# Patient Record
Sex: Female | Born: 1975
Health system: Southern US, Community
[De-identification: ages and names within clinical notes are randomized; demographics above are authoritative.]

## PROBLEM LIST (undated history)

## (undated) DIAGNOSIS — R011 Cardiac murmur, unspecified: Secondary | ICD-10-CM

## (undated) DIAGNOSIS — C801 Malignant (primary) neoplasm, unspecified: Secondary | ICD-10-CM

## (undated) DIAGNOSIS — Q793 Gastroschisis: Secondary | ICD-10-CM

## (undated) DIAGNOSIS — Q447 Other congenital malformation of liver, unspecified: Secondary | ICD-10-CM

## (undated) DIAGNOSIS — E785 Hyperlipidemia, unspecified: Secondary | ICD-10-CM

## (undated) HISTORY — PX: TONSILLECTOMY: SUR1361

## (undated) HISTORY — PX: OTHER SURGICAL HISTORY: SHX169

## (undated) HISTORY — DX: Malignant (primary) neoplasm, unspecified: C80.1

## (undated) HISTORY — DX: Hyperlipidemia, unspecified: E78.5

## (undated) HISTORY — DX: Cardiac murmur, unspecified: R01.1

## (undated) HISTORY — DX: Gastroschisis: Q79.3

## (undated) HISTORY — PX: THYROIDECTOMY: SHX17

## (undated) HISTORY — DX: Other congenital malformation of liver, unspecified: Q44.70

---

## 1998-05-05 ENCOUNTER — Encounter: Payer: Self-pay | Admitting: Emergency Medicine

## 1998-05-05 ENCOUNTER — Emergency Department (HOSPITAL_COMMUNITY): Admission: EM | Admit: 1998-05-05 | Discharge: 1998-05-06 | Payer: Self-pay | Admitting: Emergency Medicine

## 1998-05-06 ENCOUNTER — Observation Stay (HOSPITAL_COMMUNITY): Admission: AD | Admit: 1998-05-06 | Discharge: 1998-05-07 | Payer: Self-pay | Admitting: Internal Medicine

## 1998-12-22 ENCOUNTER — Other Ambulatory Visit: Admission: RE | Admit: 1998-12-22 | Discharge: 1998-12-22 | Payer: Self-pay

## 1999-12-17 ENCOUNTER — Ambulatory Visit (HOSPITAL_COMMUNITY): Admission: RE | Admit: 1999-12-17 | Discharge: 1999-12-17 | Payer: Self-pay

## 2000-10-27 ENCOUNTER — Other Ambulatory Visit: Admission: RE | Admit: 2000-10-27 | Discharge: 2000-10-27 | Payer: Self-pay | Admitting: *Deleted

## 2000-12-28 ENCOUNTER — Other Ambulatory Visit: Admission: RE | Admit: 2000-12-28 | Discharge: 2000-12-28 | Payer: Self-pay | Admitting: *Deleted

## 2000-12-28 ENCOUNTER — Encounter (INDEPENDENT_AMBULATORY_CARE_PROVIDER_SITE_OTHER): Payer: Self-pay | Admitting: Specialist

## 2001-02-01 ENCOUNTER — Encounter: Admission: RE | Admit: 2001-02-01 | Discharge: 2001-02-01 | Payer: Self-pay | Admitting: Family Medicine

## 2001-02-01 ENCOUNTER — Encounter: Payer: Self-pay | Admitting: Family Medicine

## 2001-06-14 ENCOUNTER — Other Ambulatory Visit: Admission: RE | Admit: 2001-06-14 | Discharge: 2001-06-14 | Payer: Self-pay | Admitting: Obstetrics and Gynecology

## 2001-08-07 ENCOUNTER — Encounter: Payer: Self-pay | Admitting: Family Medicine

## 2001-08-07 ENCOUNTER — Encounter: Admission: RE | Admit: 2001-08-07 | Discharge: 2001-08-07 | Payer: Self-pay | Admitting: Family Medicine

## 2001-11-01 ENCOUNTER — Other Ambulatory Visit: Admission: RE | Admit: 2001-11-01 | Discharge: 2001-11-01 | Payer: Self-pay | Admitting: Obstetrics and Gynecology

## 2002-03-13 ENCOUNTER — Encounter: Payer: Self-pay | Admitting: Family Medicine

## 2002-03-13 ENCOUNTER — Encounter: Admission: RE | Admit: 2002-03-13 | Discharge: 2002-03-13 | Payer: Self-pay | Admitting: Family Medicine

## 2002-07-18 ENCOUNTER — Other Ambulatory Visit: Admission: RE | Admit: 2002-07-18 | Discharge: 2002-07-18 | Payer: Self-pay | Admitting: Obstetrics and Gynecology

## 2003-05-06 ENCOUNTER — Emergency Department (HOSPITAL_COMMUNITY): Admission: EM | Admit: 2003-05-06 | Discharge: 2003-05-06 | Payer: Self-pay | Admitting: Emergency Medicine

## 2003-08-19 ENCOUNTER — Other Ambulatory Visit: Admission: RE | Admit: 2003-08-19 | Discharge: 2003-08-19 | Payer: Self-pay | Admitting: Obstetrics and Gynecology

## 2004-05-03 ENCOUNTER — Inpatient Hospital Stay (HOSPITAL_COMMUNITY): Admission: EM | Admit: 2004-05-03 | Discharge: 2004-05-06 | Payer: Self-pay | Admitting: Emergency Medicine

## 2005-05-11 ENCOUNTER — Ambulatory Visit: Payer: Self-pay | Admitting: Gastroenterology

## 2005-06-03 ENCOUNTER — Ambulatory Visit: Payer: Self-pay | Admitting: Gastroenterology

## 2005-06-03 ENCOUNTER — Encounter (INDEPENDENT_AMBULATORY_CARE_PROVIDER_SITE_OTHER): Payer: Self-pay | Admitting: *Deleted

## 2005-06-03 LAB — HM COLONOSCOPY

## 2005-07-22 ENCOUNTER — Ambulatory Visit (HOSPITAL_COMMUNITY): Admission: RE | Admit: 2005-07-22 | Discharge: 2005-07-22 | Payer: Self-pay

## 2007-08-01 ENCOUNTER — Inpatient Hospital Stay (HOSPITAL_COMMUNITY): Admission: AD | Admit: 2007-08-01 | Discharge: 2007-08-02 | Payer: Self-pay | Admitting: Obstetrics & Gynecology

## 2007-08-18 ENCOUNTER — Ambulatory Visit (HOSPITAL_COMMUNITY): Admission: RE | Admit: 2007-08-18 | Discharge: 2007-08-18 | Payer: Self-pay | Admitting: Obstetrics

## 2007-08-24 ENCOUNTER — Ambulatory Visit: Payer: Self-pay | Admitting: Oncology

## 2007-08-31 LAB — CBC WITH DIFFERENTIAL/PLATELET
BASO%: 0.3 % (ref 0.0–2.0)
Basophils Absolute: 0 10*3/uL (ref 0.0–0.1)
EOS%: 1.1 % (ref 0.0–7.0)
Eosinophils Absolute: 0.1 10*3/uL (ref 0.0–0.5)
HCT: 30 % — ABNORMAL LOW (ref 34.8–46.6)
HGB: 10.6 g/dL — ABNORMAL LOW (ref 11.6–15.9)
LYMPH%: 13.7 % — ABNORMAL LOW (ref 14.0–48.0)
MCH: 31.3 pg (ref 26.0–34.0)
MCHC: 35.4 g/dL (ref 32.0–36.0)
MCV: 88.6 fL (ref 81.0–101.0)
MONO#: 0.9 10*3/uL (ref 0.1–0.9)
MONO%: 10.5 % (ref 0.0–13.0)
NEUT#: 6.3 10*3/uL (ref 1.5–6.5)
NEUT%: 74.4 % (ref 39.6–76.8)
Platelets: 101 10*3/uL — ABNORMAL LOW (ref 145–400)
RBC: 3.39 10*6/uL — ABNORMAL LOW (ref 3.70–5.32)
RDW: 13.1 % (ref 11.3–14.5)
WBC: 8.5 10*3/uL (ref 3.9–10.0)
lymph#: 1.2 10*3/uL (ref 0.9–3.3)

## 2007-08-31 LAB — CHCC SMEAR

## 2007-09-11 LAB — CBC WITH DIFFERENTIAL/PLATELET
BASO%: 0.3 % (ref 0.0–2.0)
Basophils Absolute: 0 10*3/uL (ref 0.0–0.1)
EOS%: 0.7 % (ref 0.0–7.0)
Eosinophils Absolute: 0.1 10*3/uL (ref 0.0–0.5)
HCT: 32 % — ABNORMAL LOW (ref 34.8–46.6)
HGB: 11.4 g/dL — ABNORMAL LOW (ref 11.6–15.9)
LYMPH%: 18.8 % (ref 14.0–48.0)
MCH: 31.7 pg (ref 26.0–34.0)
MCHC: 35.5 g/dL (ref 32.0–36.0)
MCV: 89.2 fL (ref 81.0–101.0)
MONO#: 0.7 10*3/uL (ref 0.1–0.9)
MONO%: 8.7 % (ref 0.0–13.0)
NEUT#: 5.9 10*3/uL (ref 1.5–6.5)
NEUT%: 71.5 % (ref 39.6–76.8)
Platelets: 108 10*3/uL — ABNORMAL LOW (ref 145–400)
RBC: 3.58 10*6/uL — ABNORMAL LOW (ref 3.70–5.32)
RDW: 13.6 % (ref 11.3–14.5)
WBC: 8.3 10*3/uL (ref 3.9–10.0)
lymph#: 1.6 10*3/uL (ref 0.9–3.3)

## 2007-09-27 LAB — CBC WITH DIFFERENTIAL/PLATELET
BASO%: 0.4 % (ref 0.0–2.0)
Basophils Absolute: 0 10*3/uL (ref 0.0–0.1)
EOS%: 0.5 % (ref 0.0–7.0)
Eosinophils Absolute: 0 10*3/uL (ref 0.0–0.5)
HCT: 33.1 % — ABNORMAL LOW (ref 34.8–46.6)
HGB: 11.6 g/dL (ref 11.6–15.9)
LYMPH%: 19 % (ref 14.0–48.0)
MCH: 31.4 pg (ref 26.0–34.0)
MCHC: 35 g/dL (ref 32.0–36.0)
MCV: 89.7 fL (ref 81.0–101.0)
MONO#: 0.8 10*3/uL (ref 0.1–0.9)
MONO%: 9.6 % (ref 0.0–13.0)
NEUT#: 5.9 10*3/uL (ref 1.5–6.5)
NEUT%: 70.5 % (ref 39.6–76.8)
Platelets: 104 10*3/uL — ABNORMAL LOW (ref 145–400)
RBC: 3.69 10*6/uL — ABNORMAL LOW (ref 3.70–5.32)
RDW: 14.1 % (ref 11.3–14.5)
WBC: 8.3 10*3/uL (ref 3.9–10.0)
lymph#: 1.6 10*3/uL (ref 0.9–3.3)

## 2007-10-05 LAB — CBC WITH DIFFERENTIAL/PLATELET
BASO%: 0.2 % (ref 0.0–2.0)
Basophils Absolute: 0 10*3/uL (ref 0.0–0.1)
EOS%: 0.6 % (ref 0.0–7.0)
Eosinophils Absolute: 0.1 10*3/uL (ref 0.0–0.5)
HCT: 33.8 % — ABNORMAL LOW (ref 34.8–46.6)
HGB: 11.9 g/dL (ref 11.6–15.9)
LYMPH%: 20.5 % (ref 14.0–48.0)
MCH: 31.5 pg (ref 26.0–34.0)
MCHC: 35.2 g/dL (ref 32.0–36.0)
MCV: 89.6 fL (ref 81.0–101.0)
MONO#: 0.9 10*3/uL (ref 0.1–0.9)
MONO%: 10.1 % (ref 0.0–13.0)
NEUT#: 5.9 10*3/uL (ref 1.5–6.5)
NEUT%: 68.6 % (ref 39.6–76.8)
Platelets: 96 10*3/uL — ABNORMAL LOW (ref 145–400)
RBC: 3.78 10*6/uL (ref 3.70–5.32)
RDW: 13.9 % (ref 11.3–14.5)
WBC: 8.6 10*3/uL (ref 3.9–10.0)
lymph#: 1.8 10*3/uL (ref 0.9–3.3)

## 2007-10-10 ENCOUNTER — Ambulatory Visit: Payer: Self-pay | Admitting: Oncology

## 2007-10-12 LAB — CBC WITH DIFFERENTIAL/PLATELET
BASO%: 0.5 % (ref 0.0–2.0)
Basophils Absolute: 0 10*3/uL (ref 0.0–0.1)
EOS%: 0.6 % (ref 0.0–7.0)
Eosinophils Absolute: 0 10*3/uL (ref 0.0–0.5)
HCT: 35.4 % (ref 34.8–46.6)
HGB: 12.6 g/dL (ref 11.6–15.9)
LYMPH%: 19.2 % (ref 14.0–48.0)
MCH: 31.8 pg (ref 26.0–34.0)
MCHC: 35.5 g/dL (ref 32.0–36.0)
MCV: 89.5 fL (ref 81.0–101.0)
MONO#: 0.6 10*3/uL (ref 0.1–0.9)
MONO%: 7.1 % (ref 0.0–13.0)
NEUT#: 6.2 10*3/uL (ref 1.5–6.5)
NEUT%: 72.6 % (ref 39.6–76.8)
Platelets: 103 10*3/uL — ABNORMAL LOW (ref 145–400)
RBC: 3.95 10*6/uL (ref 3.70–5.32)
RDW: 14.4 % (ref 11.3–14.5)
WBC: 8.6 10*3/uL (ref 3.9–10.0)
lymph#: 1.7 10*3/uL (ref 0.9–3.3)

## 2007-10-18 ENCOUNTER — Inpatient Hospital Stay (HOSPITAL_COMMUNITY): Admission: AD | Admit: 2007-10-18 | Discharge: 2007-10-22 | Payer: Self-pay | Admitting: Obstetrics and Gynecology

## 2007-11-28 ENCOUNTER — Ambulatory Visit: Payer: Self-pay | Admitting: Oncology

## 2007-11-30 LAB — CBC WITH DIFFERENTIAL/PLATELET
BASO%: 0.2 % (ref 0.0–2.0)
Basophils Absolute: 0 10*3/uL (ref 0.0–0.1)
EOS%: 1.3 % (ref 0.0–7.0)
Eosinophils Absolute: 0.1 10*3/uL (ref 0.0–0.5)
HCT: 36.5 % (ref 34.8–46.6)
HGB: 12.6 g/dL (ref 11.6–15.9)
LYMPH%: 29.3 % (ref 14.0–48.0)
MCH: 30.8 pg (ref 26.0–34.0)
MCHC: 34.6 g/dL (ref 32.0–36.0)
MCV: 89 fL (ref 81.0–101.0)
MONO#: 0.5 10*3/uL (ref 0.1–0.9)
MONO%: 6.8 % (ref 0.0–13.0)
NEUT#: 4.3 10*3/uL (ref 1.5–6.5)
NEUT%: 62.4 % (ref 39.6–76.8)
Platelets: 125 10*3/uL — ABNORMAL LOW (ref 145–400)
RBC: 4.1 10*6/uL (ref 3.70–5.32)
RDW: 13.4 % (ref 11.3–14.5)
WBC: 6.9 10*3/uL (ref 3.9–10.0)
lymph#: 2 10*3/uL (ref 0.9–3.3)

## 2008-01-21 ENCOUNTER — Ambulatory Visit: Payer: Self-pay | Admitting: Oncology

## 2008-03-14 ENCOUNTER — Ambulatory Visit: Payer: Self-pay | Admitting: Oncology

## 2009-03-05 ENCOUNTER — Encounter: Admission: RE | Admit: 2009-03-05 | Discharge: 2009-03-05 | Payer: Self-pay | Admitting: Endocrinology

## 2010-07-19 ENCOUNTER — Encounter: Payer: Self-pay | Admitting: Endocrinology

## 2010-07-19 ENCOUNTER — Encounter: Payer: Self-pay | Admitting: *Deleted

## 2010-10-27 ENCOUNTER — Other Ambulatory Visit: Payer: Self-pay | Admitting: Obstetrics

## 2010-11-10 NOTE — Discharge Summary (Signed)
NAME:  April Herman, April Herman NO.:  [id number]   MEDICAL RECORD NO.:  [id number]          PATIENT TYPE:  INP   LOCATION:  9107                          FACILITY:  [facility]   PHYSICIAN:  Lendon Colonel, MD   DATE OF BIRTH:  [birth year]   DATE OF ADMISSION:  10/18/2007  DATE OF DISCHARGE:  10/22/2007                               DISCHARGE SUMMARY   CHIEF COMPLAINT:  Induction of labor.   HISTORY OF PRESENT ILLNESS:  This 35 year old G1 admitted at 40 weeks  and 4 days for induction of labor.  The patient has been followed in  pregnancy for gestational thrombocytopenia versus ITP.  She is status  post maternal fetal medicine consultation and had hematology following.  Her platelets had gone down as low as the 80s, which in the later part  of the pregnancy have been stable in the low 100s.  The patient also has  a history of HSV for which she has been on Valtrex since 32 weeks, and  the patient has history of gastroschisis repair as a child, a history of  in-vitro fertilization for this current pregnancy, has a history of  hypothyroidism with her last thyroid checked approximately 1 month prior  to admission which was within normal limits.  She had no adjustments to  her levothyroxine since the start of pregnancy.  She also was found to  have an increased nuchal thickness at the early first-trimester  screening and normal ultrasound report, normal cardiac fetal echo.  The  patient is O negative and had a Klebsiella UTI in the beginning of the  pregnancy.  Her prenatal labs were unremarkable, other than her  thrombocytopenia.   PAST MEDICAL HISTORY:  Her medical history is significant for  infertility status post partial thyroidectomy with resultant  hypothyroidism, gestational thrombocytopenia, HSV, and gastroschisis  repair.   ALLERGIES:  She has had no known drug allergies.   PHYSICAL EXAMINATION:  Her physical exam at the time of admission was  unremarkable with an  unfavorable cervix.  Fetal testing was reactive.   HOSPITAL COURSE:  Given 40 weeks and 4 days' gestation with several  prenatal issues including thrombocytopenia for which is currently  stable, decision was made to begin cervical ripening.  The patient was  given Misoprostol overnight with a plan to closely follow her platelets  and began dexamethasone if her platelets dropped.  On hospital day #2,  the patient began getting uncomfortable with her contractions after  Pitocin induction.  The patient had gotten a single dose of Misoprostol  overnight.  Her contractions began every 2 minutes and she was unable to  get a second dose of Misoprostol.  Pitocin 2 milliunits were started at  4:30 a.m.  Throughout the day, her Pitocin was increased.  The patient  was uncomfortable and received an epidural.  Later on hospital day #2,  given the lack of cervical dilatation, the cervical Foley was placed  without complication.  By 8 p.m. on hospital day #2, the patient had  made no significant cervical change.  Her cervical Foley was still in  place, as well as her epidural,  and decision was made to turn off the  Pitocin, allow the patient to eat and rest overnight with further  cervical ripening. overnight.  The patient had her epidural for  continued rest overnight.  By hospital day #3 at 8 in the morning, the  patient's cervix had dilated to 5 cm, 80%, and -2 station.  She was  AROM'd for clear fluid.  The baby's testing remained reactive and her  platelets were stable at 85.  The plan was to continue to check her  platelets q.4 h.  The patient at 2 p.m. had a spontaneous vaginal  delivery over a third-degree episiotomy, cord pH was 7.1.  An episiotomy  was found to expedite delivery as the vacuum was contraindicated given  the thrombocytopenia.  That note can be found in a separate dictation.  Postpartum, the patient did well.  Her platelets remained stable at 87  and on postoperative day #2,  the patient was discharged to home with  Percocet, Colace, and Motrin.   DISCHARGE DIAGNOSIS:  Status post at term vaginal delivery after  induction of labor for gestational thrombocytopenia.   DISCHARGE CONDITION:  Stable.   DISCHARGE DISPOSITION:  To home.   DISCHARGE MEDICATIONS:  Motrin, Percocet, and Colace.      Lendon Colonel, MD  Electronically Signed     KAF/MEDQ  D:  11/05/2007  T:  11/05/2007  Job:  [id number]

## 2010-11-13 NOTE — H&P (Signed)
NAME:  April Herman, April Herman NO.:  [id number]   MEDICAL RECORD NO.:  [id number]          PATIENT TYPE:  EMS   LOCATION:  ED                           FACILITY:  [facility]   PHYSICIAN:  Abigail Miyamoto, M.D. DATE OF BIRTH:  04/03/1976   DATE OF ADMISSION:  05/03/2004  DATE OF DISCHARGE:                                HISTORY & PHYSICAL   CHIEF COMPLAINT:  Abdominal pain.   HISTORY OF PRESENT ILLNESS:  This is a 35 year old female who has had a  history of small bowel obstructions in the past, who now presents with 24-  hour history of cramping, abdominal pain, nausea, and vomiting.  Before she  has had emesis at least four times at the start of last evening.  She also  reports she has had both constipation and diarrhea on and off for a week.  The pain is cramping and continuous.   PAST MEDICAL HISTORY:  1.  Significant for having had a gastroschisis as a newborn and surgery      secondary to this.  2.  She has had multiple small bowel obstructions in the past.  All have      been resolved spontaneously with conservative measures and NG      suctioning.  3.  She has also had a history of thyroid cancer, status post lobectomy by      Dr. Marcy Panning.   CURRENT MEDICATIONS:  1.  Birth control medications.  2.  Synthroid.   FAMILY HISTORY:  Unremarkable.   SOCIAL HISTORY:  She does not smoke.  She does not drink alcohol.   ALLERGIES:  She has no known drug allergies.   REVIEW OF SYSTEMS:  Negative for chest pain, shortness of breath, and  dysuria, and is otherwise negative except for the above mentioned.   PHYSICAL EXAMINATION:  GENERAL:  Reveals a thin female in mild discomfort.  Generally she is mildly uncomfortable.  VITAL SIGNS:  Temperature is 97.7, pulse is 91, respiratory rate is 16,  blood pressure is 103/70.  HEENT:  Eyes:  She is anicteric.  Pupils are reactive bilaterally.  Ears,  nose, mouth, and throat:  Her ears and nose are normal in appearance.  Oropharynx is clear.  NECK:  Supple.  There are no neck masses.  There is no thyromegaly.  There  is no adenopathy.  LUNGS:  Clear to auscultation bilaterally.  Normal respiratory effort.  CARDIOVASCULAR:  Regular rate and rhythm with no murmurs.  There is no  peripheral edema.  ABDOMEN:  Soft.  There is mild diffuse tenderness.  It is nondistended.  There are rare bowel sounds.  There is a well-healed transverse incision  across the abdomen with no evidence of hernias.  EXTREMITIES:  Warm and well-perfused.  NEUROLOGIC:  Nonfocal.   LABORATORY DATA:  The patient has labs from Urgent Care, which shows her to  have a white blood count of 11.4,  hemoglobin of 13.9, and platelets of 163.   She has a TV of the abdomen showing her to have a large amount of stool in  her colon.  There are some mild air and diffuse small bowel leaks and a  mildly dilated stomach.   IMPRESSION:  Patient with a partial small bowel obstruction.  At this point  the plan will be to admit her to the hospital, IV rehydrate her, give her  enemas to help clear her constipation under good __________control, and we  may need to place the NG tube __________ we proceed conservatively and be  successful resolving this obstruction without surgery as she has done in the  past.      DB/MEDQ  D:  05/03/2004  T:  05/03/2004  Job:  [id number]

## 2011-03-19 LAB — FETAL FIBRONECTIN: Fetal Fibronectin: NEGATIVE

## 2011-03-23 LAB — CBC
HCT: 27.9 — ABNORMAL LOW
HCT: 32.1 — ABNORMAL LOW
HCT: 33.1 — ABNORMAL LOW
HCT: 36.8
HCT: 38.6
Hemoglobin: 11.4 — ABNORMAL LOW
Hemoglobin: 11.8 — ABNORMAL LOW
Hemoglobin: 13
Hemoglobin: 13.4
Hemoglobin: 9.9 — ABNORMAL LOW
MCHC: 34.7
MCHC: 35.3
MCHC: 35.4
MCHC: 35.5
MCHC: 35.5
MCHC: 35.7
MCV: 91
MCV: 91
MCV: 91.1
MCV: 91.3
MCV: 91.5
MCV: 91.7
Platelets: 105 — ABNORMAL LOW
Platelets: 81 — ABNORMAL LOW
Platelets: 83 — ABNORMAL LOW
Platelets: 85 — ABNORMAL LOW
RBC: 3.06 — ABNORMAL LOW
RBC: 3.53 — ABNORMAL LOW
RBC: 3.64 — ABNORMAL LOW
RBC: 3.64 — ABNORMAL LOW
RBC: 4.22
RDW: 14.1
RDW: 14.1
RDW: 14.2
RDW: 14.3
RDW: 14.4
RDW: 14.4
WBC: 12.9 — ABNORMAL HIGH
WBC: 13.3 — ABNORMAL HIGH
WBC: 13.3 — ABNORMAL HIGH
WBC: 15.9 — ABNORMAL HIGH

## 2011-03-23 LAB — RH IMMUNE GLOB WKUP(>/=20WKS)(NOT WOMEN'S HOSP)

## 2011-03-23 LAB — PLATELET COUNT: Platelets: 96 — ABNORMAL LOW

## 2011-03-23 LAB — RPR: RPR Ser Ql: NONREACTIVE

## 2013-05-11 ENCOUNTER — Other Ambulatory Visit: Payer: Self-pay | Admitting: Family Medicine

## 2013-05-11 DIAGNOSIS — Z9889 Other specified postprocedural states: Secondary | ICD-10-CM

## 2013-05-16 ENCOUNTER — Ambulatory Visit
Admission: RE | Admit: 2013-05-16 | Discharge: 2013-05-16 | Disposition: A | Discharge status: Home / Self Care | Payer: 59 | Source: Ambulatory Visit | Attending: Family Medicine | Admitting: Family Medicine

## 2013-05-16 DIAGNOSIS — Z9889 Other specified postprocedural states: Secondary | ICD-10-CM

## 2013-12-24 ENCOUNTER — Other Ambulatory Visit: Payer: Self-pay | Admitting: Family Medicine

## 2013-12-24 DIAGNOSIS — E890 Postprocedural hypothyroidism: Secondary | ICD-10-CM

## 2013-12-24 DIAGNOSIS — Z9009 Acquired absence of other part of head and neck: Secondary | ICD-10-CM

## 2013-12-26 ENCOUNTER — Other Ambulatory Visit: Payer: 59

## 2013-12-26 ENCOUNTER — Ambulatory Visit
Admission: RE | Admit: 2013-12-26 | Discharge: 2013-12-26 | Disposition: A | Discharge status: Home / Self Care | Payer: 59 | Source: Ambulatory Visit | Attending: Family Medicine | Admitting: Family Medicine

## 2013-12-26 DIAGNOSIS — E890 Postprocedural hypothyroidism: Secondary | ICD-10-CM

## 2013-12-26 DIAGNOSIS — Z9009 Acquired absence of other part of head and neck: Secondary | ICD-10-CM

## 2014-04-01 ENCOUNTER — Telehealth (INDEPENDENT_AMBULATORY_CARE_PROVIDER_SITE_OTHER): Payer: Self-pay

## 2014-04-01 DIAGNOSIS — R109 Unspecified abdominal pain: Secondary | ICD-10-CM

## 2014-04-01 NOTE — Telephone Encounter (Signed)
Pt seen in office today by Dr Magnus IvanBlackman and order placed in epic for CT A/P w/contrast.

## 2014-04-04 ENCOUNTER — Other Ambulatory Visit: Payer: 59

## 2014-04-10 ENCOUNTER — Ambulatory Visit
Admission: RE | Admit: 2014-04-10 | Discharge: 2014-04-10 | Disposition: A | Discharge status: Home / Self Care | Payer: 59 | Source: Ambulatory Visit | Attending: Surgery | Admitting: Surgery

## 2014-04-10 DIAGNOSIS — R109 Unspecified abdominal pain: Secondary | ICD-10-CM

## 2014-04-10 MED ORDER — IOHEXOL 300 MG/ML  SOLN
100.0000 mL | Freq: Once | INTRAMUSCULAR | Status: AC | PRN
  Administered 2014-04-10: 100 mL via INTRAVENOUS

## 2014-04-16 ENCOUNTER — Telehealth (INDEPENDENT_AMBULATORY_CARE_PROVIDER_SITE_OTHER): Payer: Self-pay

## 2014-04-16 NOTE — Telephone Encounter (Addendum)
Pt calling for CT scan results from 04/10/14.  Results given.  Patient would like to speak with Dr. Magnus IvanBlackman regarding her symptoms and the results of the exam.

## 2015-06-19 ENCOUNTER — Encounter: Payer: Self-pay | Admitting: Gastroenterology

## 2015-12-04 DIAGNOSIS — Z85850 Personal history of malignant neoplasm of thyroid: Secondary | ICD-10-CM | POA: Insufficient documentation

## 2017-01-26 DIAGNOSIS — D126 Benign neoplasm of colon, unspecified: Secondary | ICD-10-CM | POA: Insufficient documentation

## 2017-07-10 ENCOUNTER — Ambulatory Visit (HOSPITAL_COMMUNITY)
Admission: EM | Admit: 2017-07-10 | Discharge: 2017-07-10 | Disposition: A | Discharge status: Home / Self Care | Payer: 59 | Attending: Internal Medicine | Admitting: Internal Medicine

## 2017-07-10 ENCOUNTER — Encounter (HOSPITAL_COMMUNITY): Payer: Self-pay | Admitting: *Deleted

## 2017-07-10 ENCOUNTER — Other Ambulatory Visit: Payer: Self-pay

## 2017-07-10 DIAGNOSIS — J069 Acute upper respiratory infection, unspecified: Secondary | ICD-10-CM

## 2017-07-10 DIAGNOSIS — R0981 Nasal congestion: Secondary | ICD-10-CM | POA: Diagnosis not present

## 2017-07-10 MED ORDER — PREDNISONE 50 MG PO TABS: 50.0000 mg | ORAL_TABLET | Freq: Every day | ORAL | 0 refills | Status: DC

## 2017-07-10 NOTE — ED Provider Notes (Signed)
[facility]    CSN: [id number] Arrival date & time: 07/10/17  1758     History   Chief Complaint Chief Complaint  Patient presents with  . Cough  . Nasal Congestion    HPI April Herman is a 42 y.o. female.   She presents today with about a week's history of head congestion and cough productive of phlegm, not too severe, but in the last 48 hours has had intermittent malaise, tactile temperature, headache, with increased head congestion and runny nose.  Worried about the flu, has not had her flu shot.    HPI  History reviewed. No pertinent past medical history.   Past Surgical History:  Procedure Laterality Date  . gastro surgery    . THYROIDECTOMY    . TONSILLECTOMY      Home Medications    Prior to Admission medications   Medication Sig Start Date End Date Taking? Authorizing Provider  UNKNOWN TO PATIENT BCPs   Yes [provider]  predniSONE (DELTASONE) 50 MG tablet Take 1 tablet (50 mg total) by mouth daily. 07/10/17   Isa Rankin, MD    Family History No family history on file.  Social History Social History   Tobacco Use  . Smoking status: Former Smoker  Substance Use Topics  . Alcohol use: Yes    Comment: occasionally  . Drug use: No     Allergies   Patient has no known allergies.   Review of Systems Review of Systems  All other systems reviewed and are negative.    Physical Exam Triage Vital Signs ED Triage Vitals  Enc Vitals Group     BP 07/10/17 1827 120/74     Pulse Rate 07/10/17 1827 68     Resp 07/10/17 1827 16     Temp 07/10/17 1827 (!) 97.1 F (36.2 C)     Temp Source 07/10/17 1827 Oral     SpO2 07/10/17 1827 99 %     Weight --      Height --      Pain Score 07/10/17 1828 0     Pain Loc --    Updated Vital Signs BP 120/74   Pulse 68   Temp (!) 97.1 F (36.2 C) (Oral)   Resp 16   LMP 06/15/2017 (Approximate)   SpO2 99%  Physical Exam  Constitutional: She is oriented to person, place,  and time. No distress.  Bilateral TMs are moderately dull, no erythema Moderate nasal congestion bilaterally Throat is pink with scant clear postnasal drainage  HENT:  Head: Atraumatic.  Eyes:  Conjugate gaze observed, no eye redness/discharge  Neck: Neck supple.  Cardiovascular: Normal rate and regular rhythm.  Pulmonary/Chest: No respiratory distress. She has no wheezes. She has no rales.  Lungs clear, symmetric breath sounds   Abdominal: She exhibits no distension.  Musculoskeletal: Normal range of motion.  Neurological: She is alert and oriented to person, place, and time.  Skin: Skin is warm and dry.  Nursing note and vitals reviewed.    UC Treatments / Results   Procedures Procedures (including critical care time) None today  Final Clinical Impressions(s) / UC Diagnoses   Final diagnoses:  Head congestion  Acute upper respiratory infection   Anticipate gradual improvement in head congestion/well being over the next several days.  Push fluids, rest, get some vitamin C (vitamin or fruit source) to give immune system the ingredients needed to work effectively.  Recheck for new/persistent (>3-4 more days) fever >100.5, increasing phlegm  production/nasal discharge, or if not starting to improve in a few days  ED Discharge Orders        Ordered    predniSONE (DELTASONE) 50 MG tablet  Daily     07/10/17 1858          Isa RankinMurray, Shalunda Lindh Wilson, MD 07/11/17 [phone]

## 2017-07-10 NOTE — Discharge Instructions (Addendum)
Anticipate gradual improvement in head congestion/well being over the next several days.  Push fluids, rest, get some vitamin C (vitamin or fruit source) to give immune system the ingredients needed to work effectively.  Recheck for new/persistent (>3-4 more days) fever >100.5, increasing phlegm production/nasal discharge, or if not starting to improve in a few days.

## 2017-07-10 NOTE — ED Triage Notes (Signed)
C/O starting with cough, congestion, not feeling well 2 days ago.  Today feeling much worse; c/o low energy, feeling feverish.  Has been taking Dayquil.

## 2019-04-20 ENCOUNTER — Other Ambulatory Visit: Payer: Self-pay | Admitting: Family Medicine

## 2019-04-20 DIAGNOSIS — E041 Nontoxic single thyroid nodule: Secondary | ICD-10-CM

## 2019-04-27 ENCOUNTER — Other Ambulatory Visit: Payer: 59

## 2019-05-02 ENCOUNTER — Ambulatory Visit
Admission: RE | Admit: 2019-05-02 | Discharge: 2019-05-02 | Disposition: A | Discharge status: Home / Self Care | Payer: No Typology Code available for payment source | Source: Ambulatory Visit | Attending: Family Medicine | Admitting: Family Medicine

## 2019-05-02 DIAGNOSIS — E041 Nontoxic single thyroid nodule: Secondary | ICD-10-CM

## 2019-08-28 ENCOUNTER — Ambulatory Visit: Payer: No Typology Code available for payment source | Attending: Internal Medicine

## 2019-08-28 DIAGNOSIS — Z20822 Contact with and (suspected) exposure to covid-19: Secondary | ICD-10-CM

## 2019-08-29 LAB — NOVEL CORONAVIRUS, NAA: SARS-CoV-2, NAA: NOT DETECTED

## 2019-09-27 ENCOUNTER — Other Ambulatory Visit: Payer: Self-pay | Admitting: Physician Assistant

## 2019-09-27 DIAGNOSIS — R1084 Generalized abdominal pain: Secondary | ICD-10-CM

## 2019-09-28 ENCOUNTER — Ambulatory Visit
Admission: RE | Admit: 2019-09-28 | Discharge: 2019-09-28 | Disposition: A | Discharge status: Home / Self Care | Payer: No Typology Code available for payment source | Source: Ambulatory Visit | Attending: Physician Assistant | Admitting: Physician Assistant

## 2019-09-28 ENCOUNTER — Other Ambulatory Visit: Payer: No Typology Code available for payment source

## 2019-09-28 ENCOUNTER — Other Ambulatory Visit: Payer: Self-pay

## 2019-09-28 DIAGNOSIS — R1084 Generalized abdominal pain: Secondary | ICD-10-CM

## 2019-09-28 MED ORDER — IOPAMIDOL (ISOVUE-300) INJECTION 61%
100.0000 mL | Freq: Once | INTRAVENOUS | Status: AC | PRN
  Administered 2019-09-28: 11:00:00 100 mL via INTRAVENOUS

## 2020-06-22 IMAGING — US US THYROID
1 series · 13 of 25 positions shown · non-contrast
Comparison: December 26, 2013.

CLINICAL DATA: History of thyroid cancer.  Thyroid nodule.

EXAM:
THYROID ULTRASOUND
TECHNIQUE: Ultrasound examination of the thyroid gland and adjacent soft
tissues was performed.

[Series 1: us thyroid · 0.04mm/px · 13 of 32 slices shown]
[im 1/32]
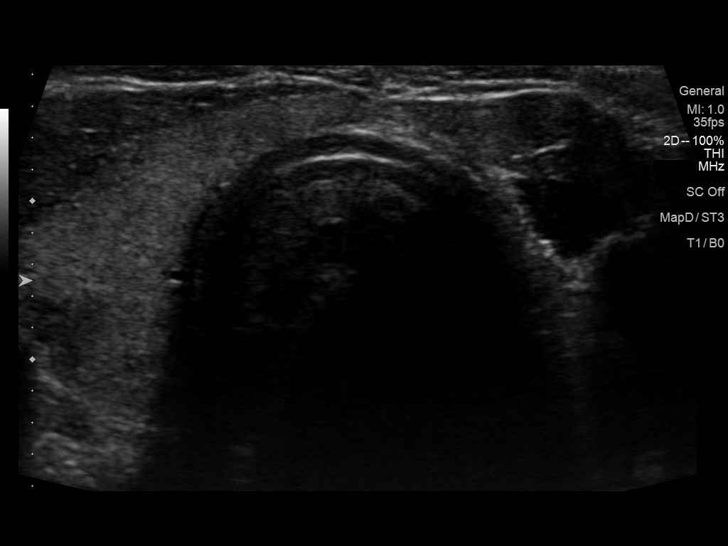
[im 3/32]
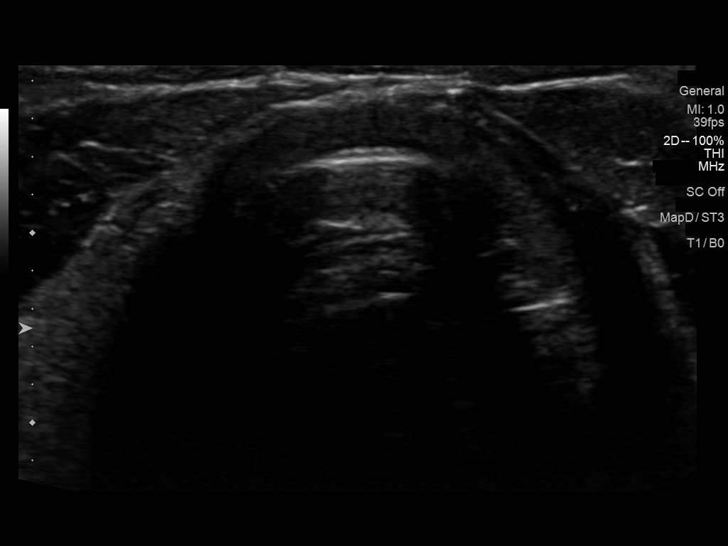
[im 6/32]
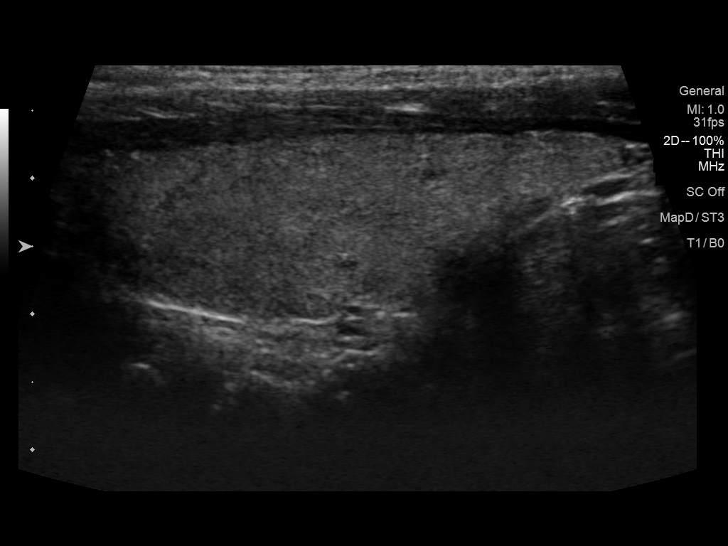
[im 8/32]
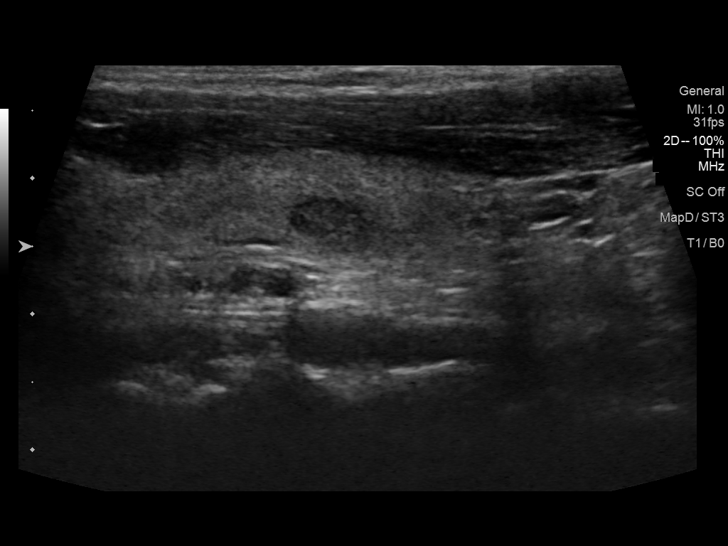
[im 11/32]
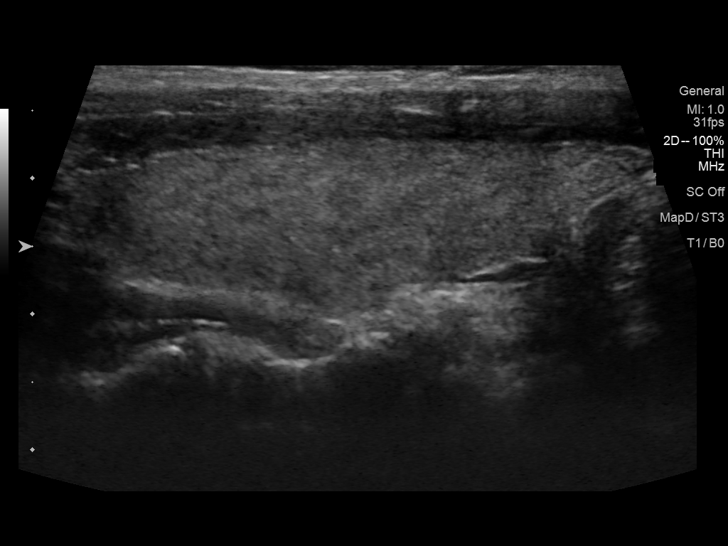
[im 13/32]
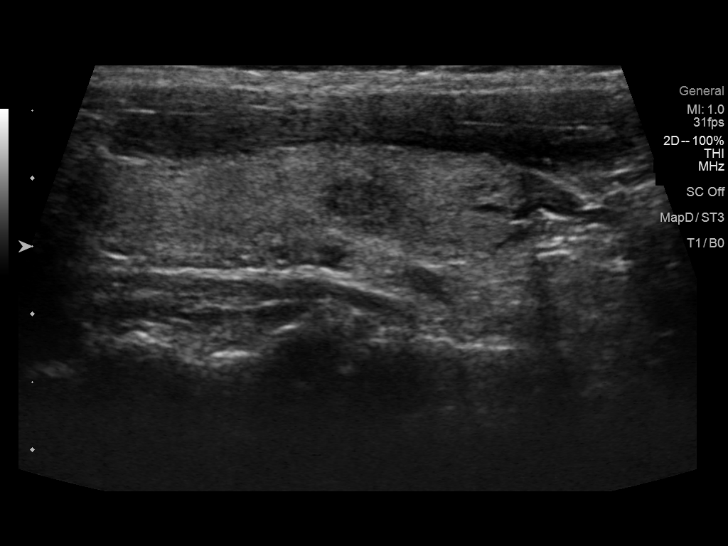
[im 16/32]
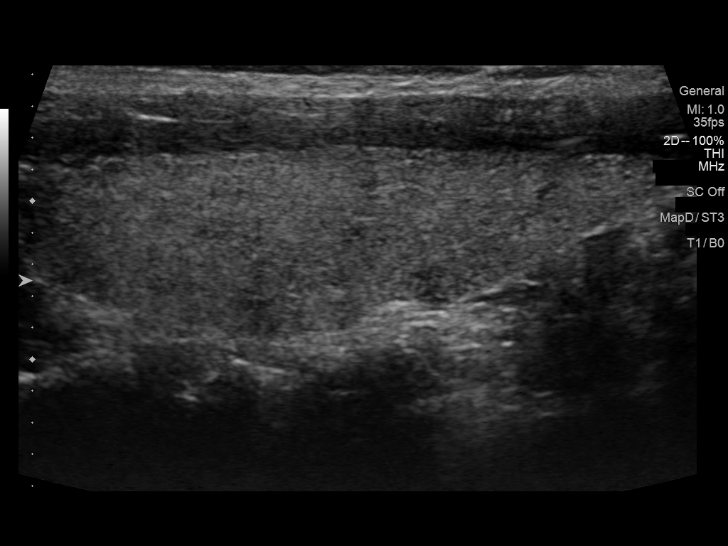
[im 19/32]
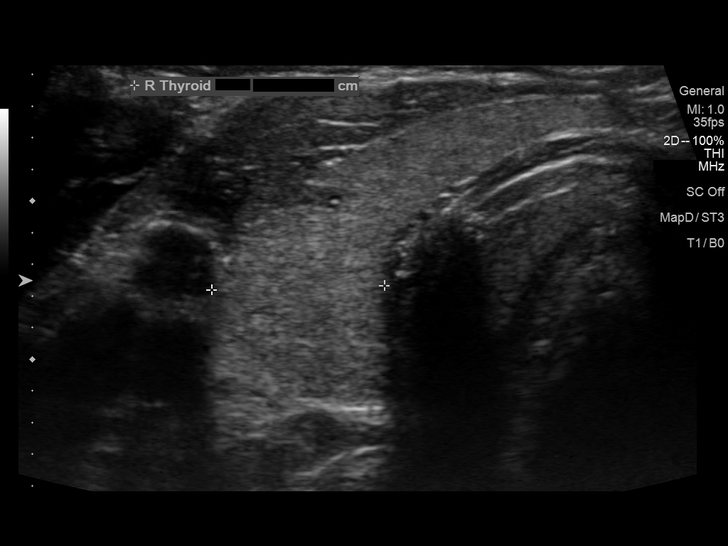
[im 21/32]
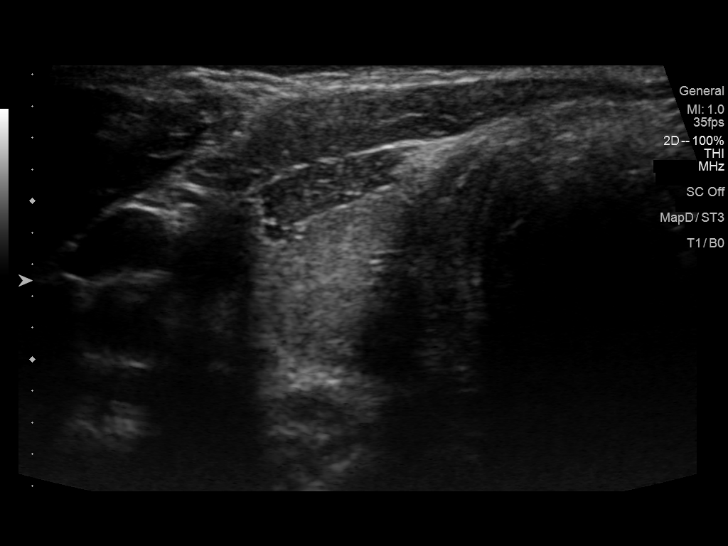
[im 24/32]
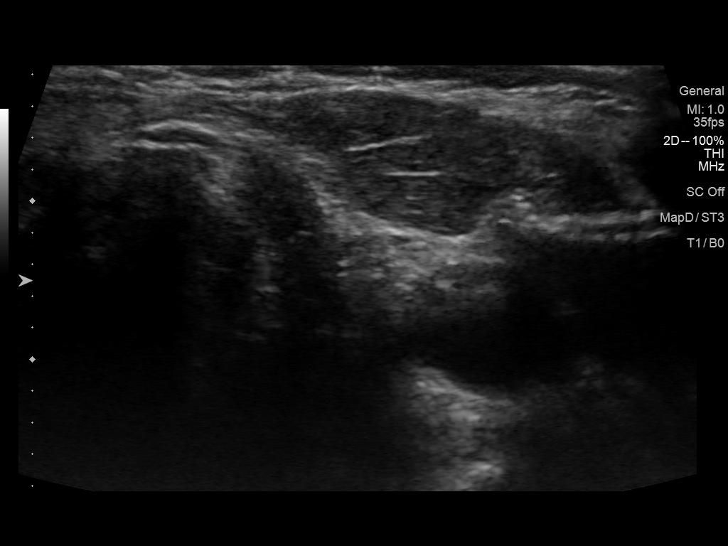
[im 26/32]
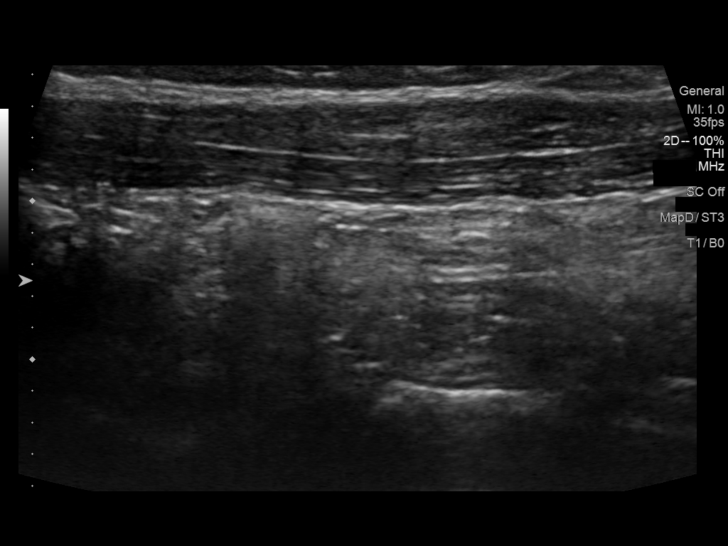
[im 29/32]
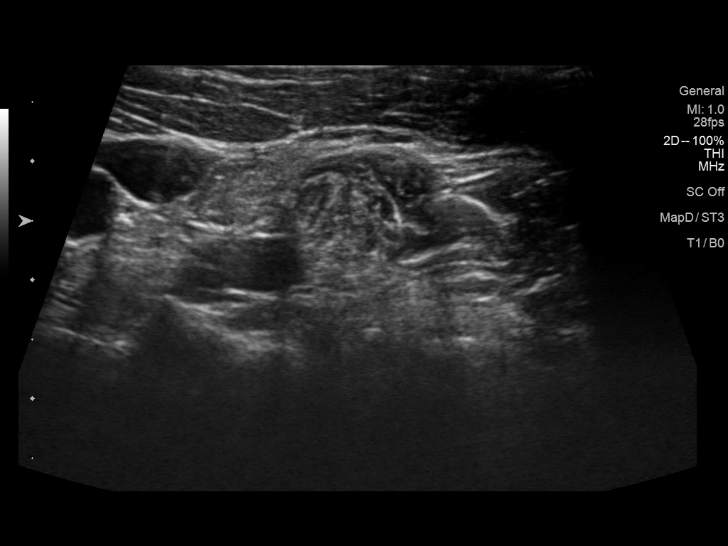
[im 32/32]
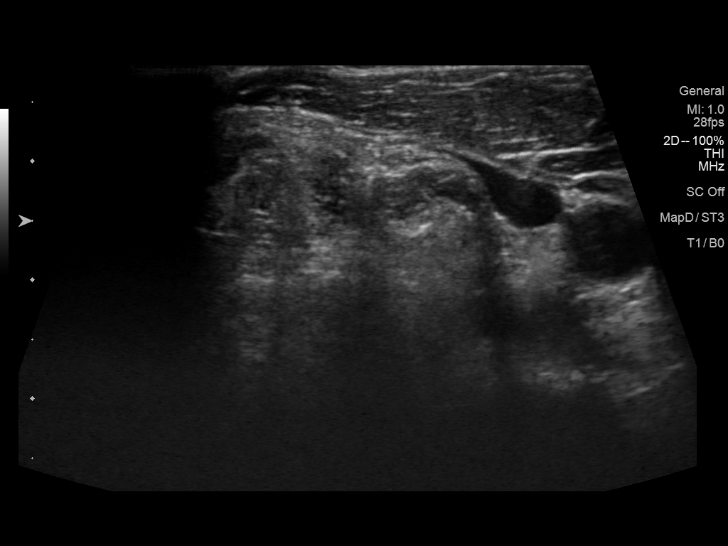

[13 of 25 positions shown; findings below may reference images not displayed]

FINDINGS: Parenchymal Echotexture: Normal

Isthmus: 0.2 cm

Right lobe: 3.9 x 1.4 x 1.7 cm (previously measuring 3.8 x 1.1 x
cm).

Left lobe: Surgically absent

_________________________________________________________

Estimated total number of nodules >/= 1 cm: 0

Number of spongiform nodules >/=  2 cm not described below (TR1): 0

Number of mixed cystic and solid nodules >/= 1.5 cm not described
below (TR2): 0

_________________________________________________________

There is a small 0.6 cm thyroid nodule in the right inferior
thyroid. This thyroid nodule does not meet criteria for follow-up or
FNA. 8 mm nodule seen in the isthmus on prior study is not well
appreciated on this exam.
IMPRESSION: 1. Status post left-sided thyroidectomy. No residual thyroid tissue
noted in the left thyroidectomy bed.
2. Small 0.6 cm thyroid nodule involving the right inferior thyroid
gland. This nodule does not meet criteria for follow-up for FNA.
3. Previously demonstrated 8 mm nodule in the isthmus is no longer
appreciated on this exam.

The above is in keeping with the ACR TI-RADS recommendations - [HOSPITAL] 0390;[DATE].

## 2021-08-26 LAB — HM COLONOSCOPY

## 2022-05-28 LAB — HM PAP SMEAR: HPV, high-risk: NEGATIVE

## 2023-08-16 LAB — HM MAMMOGRAPHY

## 2023-10-04 ENCOUNTER — Encounter: Payer: Self-pay | Admitting: Internal Medicine

## 2023-10-04 NOTE — Progress Notes (Unsigned)
   New Patient Office Visit  Subjective    Patient ID: April Herman, female    DOB: May 31, 1976  Age: 48 y.o. MRN: [id number]  CC: No chief complaint on file.   HPI April Herman is a 48 y.o. female presents to establish care.   Last PCP/physical/labs: Vella Kohler MD ***  Outpatient Encounter Medications as of 10/05/2023  Medication Sig   predniSONE (DELTASONE) 50 MG tablet Take 1 tablet (50 mg total) by mouth daily.   UNKNOWN TO PATIENT BCPs   No facility-administered encounter medications on file as of 10/05/2023.    No past medical history on file.  Past Surgical History:  Procedure Laterality Date   gastro surgery     THYROIDECTOMY     TONSILLECTOMY      No family history on file.  Social History   Socioeconomic History   Marital status: Married    Spouse name: Not on file   Number of children: Not on file   Years of education: Not on file   Highest education level: Not on file  Occupational History   Not on file  Tobacco Use   Smoking status: Former   Smokeless tobacco: Not on file  Substance and Sexual Activity   Alcohol use: Yes    Comment: occasionally   Drug use: No   Sexual activity: Not on file  Other Topics Concern   Not on file  Social History Narrative   Not on file   Social Drivers of Health   Financial Resource Strain: Not on file  Food Insecurity: Not on file  Transportation Needs: Not on file  Physical Activity: Not on file  Stress: Not on file  Social Connections: Not on file  Intimate Partner Violence: Not on file    ROS      Objective    There were no vitals taken for this visit.  Physical Exam  {Labs (Optional):23779}    Assessment & Plan:  Establishing care with new doctor, encounter for     No follow-ups on file.   [place] Charon, NP

## 2023-10-05 ENCOUNTER — Encounter: Payer: Self-pay | Admitting: General Practice

## 2023-10-05 ENCOUNTER — Ambulatory Visit (INDEPENDENT_AMBULATORY_CARE_PROVIDER_SITE_OTHER): Payer: No Typology Code available for payment source | Admitting: General Practice

## 2023-10-05 VITALS — BP 106/66 | HR 80 | Temp 97.9°F | Ht 61.5 in | Wt 108.0 lb

## 2023-10-05 DIAGNOSIS — K4090 Unilateral inguinal hernia, without obstruction or gangrene, not specified as recurrent: Secondary | ICD-10-CM | POA: Insufficient documentation

## 2023-10-05 DIAGNOSIS — Z0000 Encounter for general adult medical examination without abnormal findings: Secondary | ICD-10-CM | POA: Insufficient documentation

## 2023-10-05 DIAGNOSIS — Z800 Family history of malignant neoplasm of digestive organs: Secondary | ICD-10-CM | POA: Insufficient documentation

## 2023-10-05 DIAGNOSIS — M542 Cervicalgia: Secondary | ICD-10-CM | POA: Diagnosis not present

## 2023-10-05 DIAGNOSIS — Z85850 Personal history of malignant neoplasm of thyroid: Secondary | ICD-10-CM | POA: Diagnosis not present

## 2023-10-05 DIAGNOSIS — Z7689 Persons encountering health services in other specified circumstances: Secondary | ICD-10-CM | POA: Insufficient documentation

## 2023-10-05 DIAGNOSIS — F418 Other specified anxiety disorders: Secondary | ICD-10-CM | POA: Insufficient documentation

## 2023-10-05 DIAGNOSIS — J309 Allergic rhinitis, unspecified: Secondary | ICD-10-CM | POA: Insufficient documentation

## 2023-10-05 DIAGNOSIS — Z0001 Encounter for general adult medical examination with abnormal findings: Secondary | ICD-10-CM

## 2023-10-05 DIAGNOSIS — E039 Hypothyroidism, unspecified: Secondary | ICD-10-CM | POA: Insufficient documentation

## 2023-10-05 NOTE — Assessment & Plan Note (Signed)
 Repeat TSH pending.  Reviewed [place] of thyroid results from 2020 in care everywhere.   Orders placed for US thyroid.

## 2023-10-05 NOTE — Assessment & Plan Note (Signed)
Repeat TSH pending

## 2023-10-05 NOTE — Patient Instructions (Addendum)
 Lab orders have been placed. Please schedule a lab appointment for another day when you are fasting to have these drawn. I will be in touch once I get the results.   Ultrasound order has been placed. You will get a phone call regarding it.   You will either be contacted via phone regarding your referral to physical therapy, or you may receive a letter on your MyChart portal from our referral team with instructions for scheduling an appointment. Please let us know if you have not been contacted by anyone within two weeks.   Follow up in 4 weeks on neck pain and back.  It was a pleasure to meet you today! Please don't hesitate to contact me with any questions. Welcome to [facility]!

## 2023-10-05 NOTE — Assessment & Plan Note (Addendum)
 Uncontrolled.   Discussed treatment options.   Has gabapentin and muscle relaxer at home to use as needed.  At this time, she would like to do physical therapy.   Referral placed.

## 2023-10-05 NOTE — Assessment & Plan Note (Signed)
 Immunizations UTD. Colonoscopy UTD, due  Pap smear UTD Mammogram UTD  Discussed the importance of a healthy diet and regular exercise in order for weight loss, and to reduce the risk of further co-morbidity.  Exam stable. Labs pending.  Follow up in 1 year for repeat physical.

## 2023-10-05 NOTE — Assessment & Plan Note (Signed)
 EMR reviewed briefly.

## 2023-10-06 ENCOUNTER — Encounter: Payer: Self-pay | Admitting: Obstetrics

## 2023-10-06 ENCOUNTER — Encounter: Payer: Self-pay | Admitting: General Practice

## 2023-10-07 ENCOUNTER — Other Ambulatory Visit (INDEPENDENT_AMBULATORY_CARE_PROVIDER_SITE_OTHER)

## 2023-10-07 ENCOUNTER — Encounter: Payer: Self-pay | Admitting: General Practice

## 2023-10-07 DIAGNOSIS — E039 Hypothyroidism, unspecified: Secondary | ICD-10-CM | POA: Diagnosis not present

## 2023-10-07 DIAGNOSIS — Z85850 Personal history of malignant neoplasm of thyroid: Secondary | ICD-10-CM

## 2023-10-07 DIAGNOSIS — Z0000 Encounter for general adult medical examination without abnormal findings: Secondary | ICD-10-CM | POA: Diagnosis not present

## 2023-10-07 LAB — CBC
HCT: 40.5 % (ref 36.0–46.0)
Hemoglobin: 13.6 g/dL (ref 12.0–15.0)
MCHC: 33.6 g/dL (ref 30.0–36.0)
MCV: 92.4 fl (ref 78.0–100.0)
Platelets: 151 10*3/uL (ref 150.0–400.0)
RBC: 4.38 Mil/uL (ref 3.87–5.11)
RDW: 13.3 % (ref 11.5–15.5)
WBC: 7.5 10*3/uL (ref 4.0–10.5)

## 2023-10-07 LAB — COMPREHENSIVE METABOLIC PANEL WITH GFR
ALT: 9 U/L (ref 0–35)
AST: 14 U/L (ref 0–37)
Albumin: 4.4 g/dL (ref 3.5–5.2)
Alkaline Phosphatase: 51 U/L (ref 39–117)
BUN: 12 mg/dL (ref 6–23)
CO2: 28 meq/L (ref 19–32)
Calcium: 9.2 mg/dL (ref 8.4–10.5)
Chloride: 102 meq/L (ref 96–112)
Creatinine, Ser: 0.74 mg/dL (ref 0.40–1.20)
GFR: 94.78 mL/min (ref >60.00)
Glucose, Bld: 91 mg/dL (ref 70–99)
Potassium: 3.8 meq/L (ref 3.5–5.1)
Sodium: 137 meq/L (ref 135–145)
Total Bilirubin: 0.6 mg/dL (ref 0.2–1.2)
Total Protein: 6.8 g/dL (ref 6.0–8.3)

## 2023-10-07 LAB — LIPID PANEL
Cholesterol: 227 mg/dL — ABNORMAL HIGH (ref 0–200)
HDL: 64.8 mg/dL
LDL Cholesterol: 144 mg/dL — ABNORMAL HIGH (ref 0–99)
NonHDL: 162.03
Total CHOL/HDL Ratio: 4
Triglycerides: 92 mg/dL (ref 0.0–149.0)
VLDL: 18.4 mg/dL (ref 0.0–40.0)

## 2023-10-07 LAB — TSH: TSH: 2.78 u[IU]/mL (ref 0.35–5.50)

## 2023-10-07 LAB — HEMOGLOBIN A1C: Hgb A1c MFr Bld: 5.4 % (ref 4.6–6.5)

## 2023-10-18 ENCOUNTER — Ambulatory Visit: Admitting: General Practice

## 2023-10-24 ENCOUNTER — Ambulatory Visit
Admission: RE | Admit: 2023-10-24 | Discharge: 2023-10-24 | Disposition: A | Discharge status: Home / Self Care | Source: Ambulatory Visit | Attending: General Practice | Admitting: General Practice

## 2023-10-24 DIAGNOSIS — E039 Hypothyroidism, unspecified: Secondary | ICD-10-CM | POA: Diagnosis present

## 2023-10-24 DIAGNOSIS — Z85850 Personal history of malignant neoplasm of thyroid: Secondary | ICD-10-CM | POA: Diagnosis present

## 2023-10-28 ENCOUNTER — Ambulatory Visit: Admitting: General Practice

## 2023-10-31 ENCOUNTER — Encounter: Payer: Self-pay | Admitting: General Practice

## 2024-06-12 ENCOUNTER — Telehealth: Payer: Self-pay | Admitting: General Practice

## 2024-06-12 NOTE — Telephone Encounter (Signed)
 LM for patient that form was received yesterday, done and faxed back to number on form already.

## 2024-06-12 NOTE — Telephone Encounter (Signed)
 Copied from CRM #[id number]. Topic: General - Other >> Jun 08, 2024  3:47 PM Sasha M wrote: Reason for CRM: Pt calling because she states she faxed over a form for her insurance to get credit for completing her physcial in April of this year. She states the form cover sheet will be from [employer] and will have the fax back info on it. She would also like a call back to verify this has been received and completed.
